# Patient Record
Sex: Male | Born: 1945 | Race: Black or African American | Hispanic: No | Marital: Married | Smoking: Former smoker
Health system: Southern US, Community
[De-identification: ages and names within clinical notes are randomized; demographics above are authoritative.]

## PROBLEM LIST (undated history)

## (undated) DIAGNOSIS — E119 Type 2 diabetes mellitus without complications: Secondary | ICD-10-CM

## (undated) DIAGNOSIS — K5792 Diverticulitis of intestine, part unspecified, without perforation or abscess without bleeding: Secondary | ICD-10-CM

## (undated) DIAGNOSIS — C801 Malignant (primary) neoplasm, unspecified: Secondary | ICD-10-CM

## (undated) DIAGNOSIS — N529 Male erectile dysfunction, unspecified: Secondary | ICD-10-CM

## (undated) DIAGNOSIS — A6000 Herpesviral infection of urogenital system, unspecified: Secondary | ICD-10-CM

## (undated) DIAGNOSIS — S83209A Unspecified tear of unspecified meniscus, current injury, unspecified knee, initial encounter: Secondary | ICD-10-CM

## (undated) HISTORY — DX: Male erectile dysfunction, unspecified: N52.9

## (undated) HISTORY — DX: Diverticulitis of intestine, part unspecified, without perforation or abscess without bleeding: K57.92

## (undated) HISTORY — DX: Malignant (primary) neoplasm, unspecified: C80.1

## (undated) HISTORY — DX: Type 2 diabetes mellitus without complications: E11.9

## (undated) HISTORY — PX: OTHER SURGICAL HISTORY: SHX169

## (undated) HISTORY — PX: HEMORRHOID SURGERY: SHX153

## (undated) HISTORY — DX: Unspecified tear of unspecified meniscus, current injury, unspecified knee, initial encounter: S83.209A

## (undated) HISTORY — DX: Herpesviral infection of urogenital system, unspecified: A60.00

---

## 2001-01-10 ENCOUNTER — Other Ambulatory Visit: Admission: RE | Admit: 2001-01-10 | Discharge: 2001-01-10 | Payer: Self-pay | Admitting: Urology

## 2001-02-16 HISTORY — PX: PROSTATECTOMY: SHX69

## 2001-02-17 ENCOUNTER — Encounter: Payer: Self-pay | Admitting: Urology

## 2001-02-24 ENCOUNTER — Inpatient Hospital Stay (HOSPITAL_COMMUNITY): Admission: RE | Admit: 2001-02-24 | Discharge: 2001-02-26 | Payer: Self-pay | Admitting: Urology

## 2004-07-19 ENCOUNTER — Emergency Department (HOSPITAL_COMMUNITY): Admission: EM | Admit: 2004-07-19 | Discharge: 2004-07-20 | Payer: Self-pay | Admitting: Emergency Medicine

## 2005-10-19 DIAGNOSIS — K5792 Diverticulitis of intestine, part unspecified, without perforation or abscess without bleeding: Secondary | ICD-10-CM

## 2005-10-19 HISTORY — PX: OTHER SURGICAL HISTORY: SHX169

## 2005-10-19 HISTORY — DX: Diverticulitis of intestine, part unspecified, without perforation or abscess without bleeding: K57.92

## 2006-03-01 ENCOUNTER — Inpatient Hospital Stay (HOSPITAL_COMMUNITY): Admission: EM | Admit: 2006-03-01 | Discharge: 2006-03-10 | Payer: Self-pay | Admitting: Emergency Medicine

## 2012-03-08 ENCOUNTER — Ambulatory Visit (HOSPITAL_COMMUNITY)
Admission: RE | Admit: 2012-03-08 | Discharge: 2012-03-08 | Disposition: A | Discharge status: Home / Self Care | Payer: Federal, State, Local not specified - PPO | Source: Ambulatory Visit | Attending: Chiropractic Medicine | Admitting: Chiropractic Medicine

## 2012-03-08 ENCOUNTER — Other Ambulatory Visit (HOSPITAL_COMMUNITY): Payer: Self-pay | Admitting: Chiropractic Medicine

## 2012-03-08 DIAGNOSIS — R52 Pain, unspecified: Secondary | ICD-10-CM

## 2012-03-08 DIAGNOSIS — M5030 Other cervical disc degeneration, unspecified cervical region: Secondary | ICD-10-CM | POA: Insufficient documentation

## 2012-03-08 DIAGNOSIS — M542 Cervicalgia: Secondary | ICD-10-CM | POA: Insufficient documentation

## 2012-03-08 DIAGNOSIS — M47812 Spondylosis without myelopathy or radiculopathy, cervical region: Secondary | ICD-10-CM | POA: Insufficient documentation

## 2013-10-04 ENCOUNTER — Other Ambulatory Visit: Payer: Self-pay | Admitting: Internal Medicine

## 2013-10-04 DIAGNOSIS — N63 Unspecified lump in unspecified breast: Secondary | ICD-10-CM

## 2013-10-06 ENCOUNTER — Ambulatory Visit
Admission: RE | Admit: 2013-10-06 | Discharge: 2013-10-06 | Disposition: A | Discharge status: Home / Self Care | Payer: Federal, State, Local not specified - PPO | Source: Ambulatory Visit | Attending: Internal Medicine | Admitting: Internal Medicine

## 2013-10-06 DIAGNOSIS — N63 Unspecified lump in unspecified breast: Secondary | ICD-10-CM

## 2014-04-16 ENCOUNTER — Other Ambulatory Visit: Payer: Self-pay | Admitting: Internal Medicine

## 2014-04-16 DIAGNOSIS — N508 Other specified disorders of male genital organs: Secondary | ICD-10-CM

## 2014-04-18 ENCOUNTER — Ambulatory Visit
Admission: RE | Admit: 2014-04-18 | Discharge: 2014-04-18 | Disposition: A | Discharge status: Home / Self Care | Payer: Federal, State, Local not specified - PPO | Source: Ambulatory Visit | Attending: Internal Medicine | Admitting: Internal Medicine

## 2014-04-18 DIAGNOSIS — N508 Other specified disorders of male genital organs: Secondary | ICD-10-CM

## 2014-04-26 ENCOUNTER — Other Ambulatory Visit: Payer: Self-pay | Admitting: Internal Medicine

## 2014-04-26 DIAGNOSIS — M7989 Other specified soft tissue disorders: Secondary | ICD-10-CM

## 2014-05-11 ENCOUNTER — Ambulatory Visit
Admission: RE | Admit: 2014-05-11 | Discharge: 2014-05-11 | Disposition: A | Discharge status: Home / Self Care | Payer: Federal, State, Local not specified - PPO | Source: Ambulatory Visit | Attending: Internal Medicine | Admitting: Internal Medicine

## 2014-05-11 DIAGNOSIS — M7989 Other specified soft tissue disorders: Secondary | ICD-10-CM

## 2015-03-07 ENCOUNTER — Other Ambulatory Visit: Payer: Self-pay | Admitting: Chiropractic Medicine

## 2015-03-07 DIAGNOSIS — M5441 Lumbago with sciatica, right side: Secondary | ICD-10-CM

## 2015-09-02 ENCOUNTER — Ambulatory Visit
Admission: RE | Admit: 2015-09-02 | Discharge: 2015-09-02 | Disposition: A | Discharge status: Home / Self Care | Payer: Federal, State, Local not specified - PPO | Source: Ambulatory Visit | Attending: Internal Medicine | Admitting: Internal Medicine

## 2015-09-02 ENCOUNTER — Other Ambulatory Visit: Payer: Self-pay | Admitting: Internal Medicine

## 2015-09-02 DIAGNOSIS — M542 Cervicalgia: Secondary | ICD-10-CM

## 2015-09-19 DEATH — deceased

## 2017-11-12 IMAGING — CR DG SHOULDER 2+V*L*
3 series · 3 of 3 positions shown · non-contrast
Comparison: None.

CLINICAL DATA: Injured shoulder on 08/31/2015. Limited range of
motion.

EXAM:
LEFT SHOULDER - 2+ VIEW

[w shoulder ap internal left *]
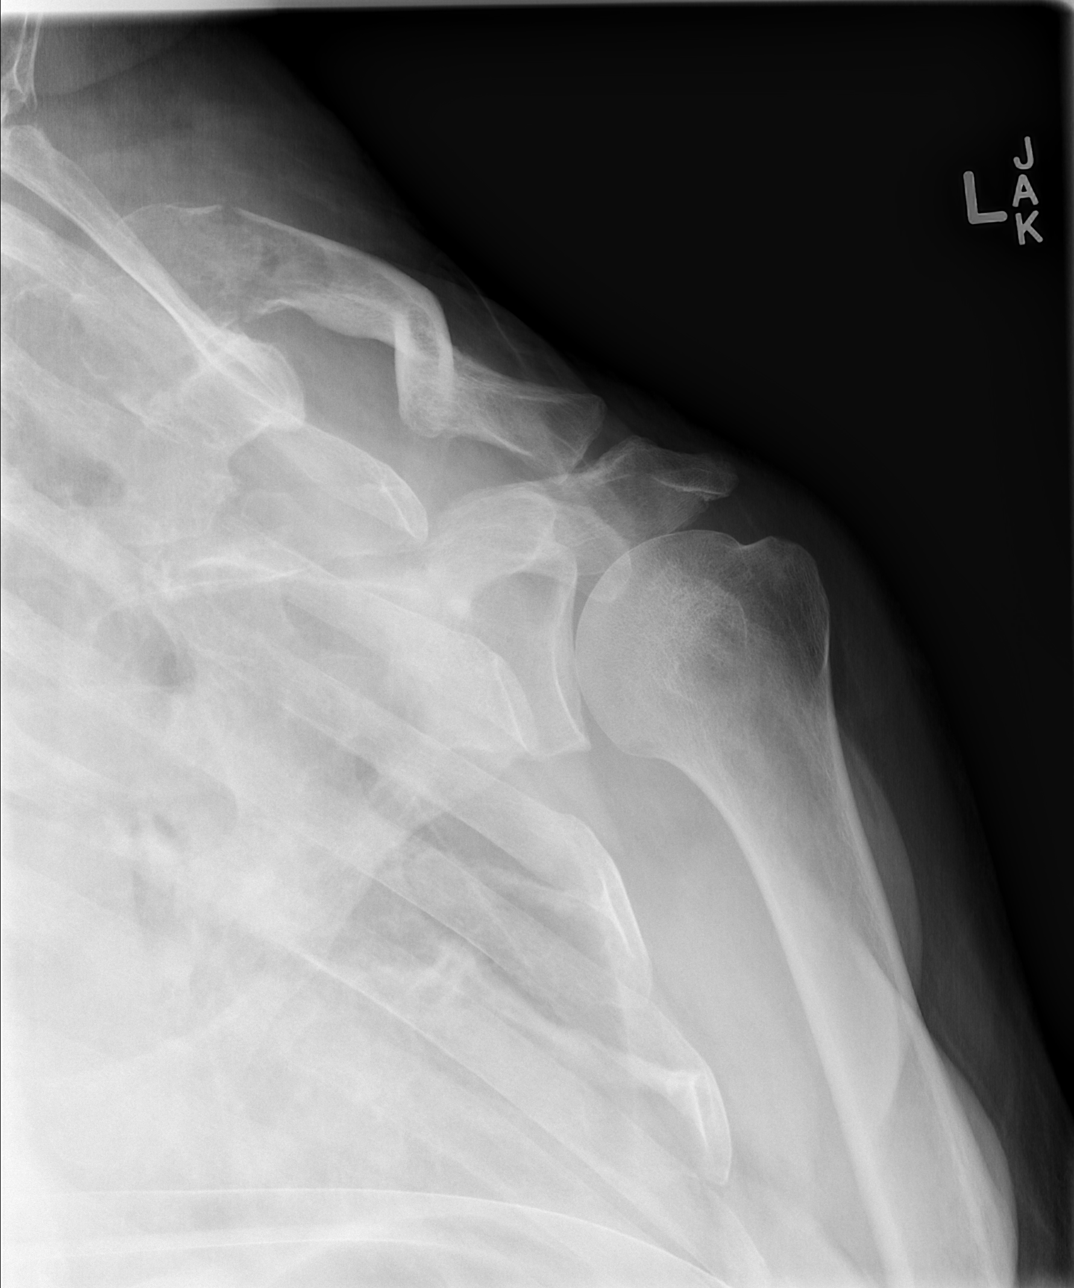

[w shoulder ap external left *]
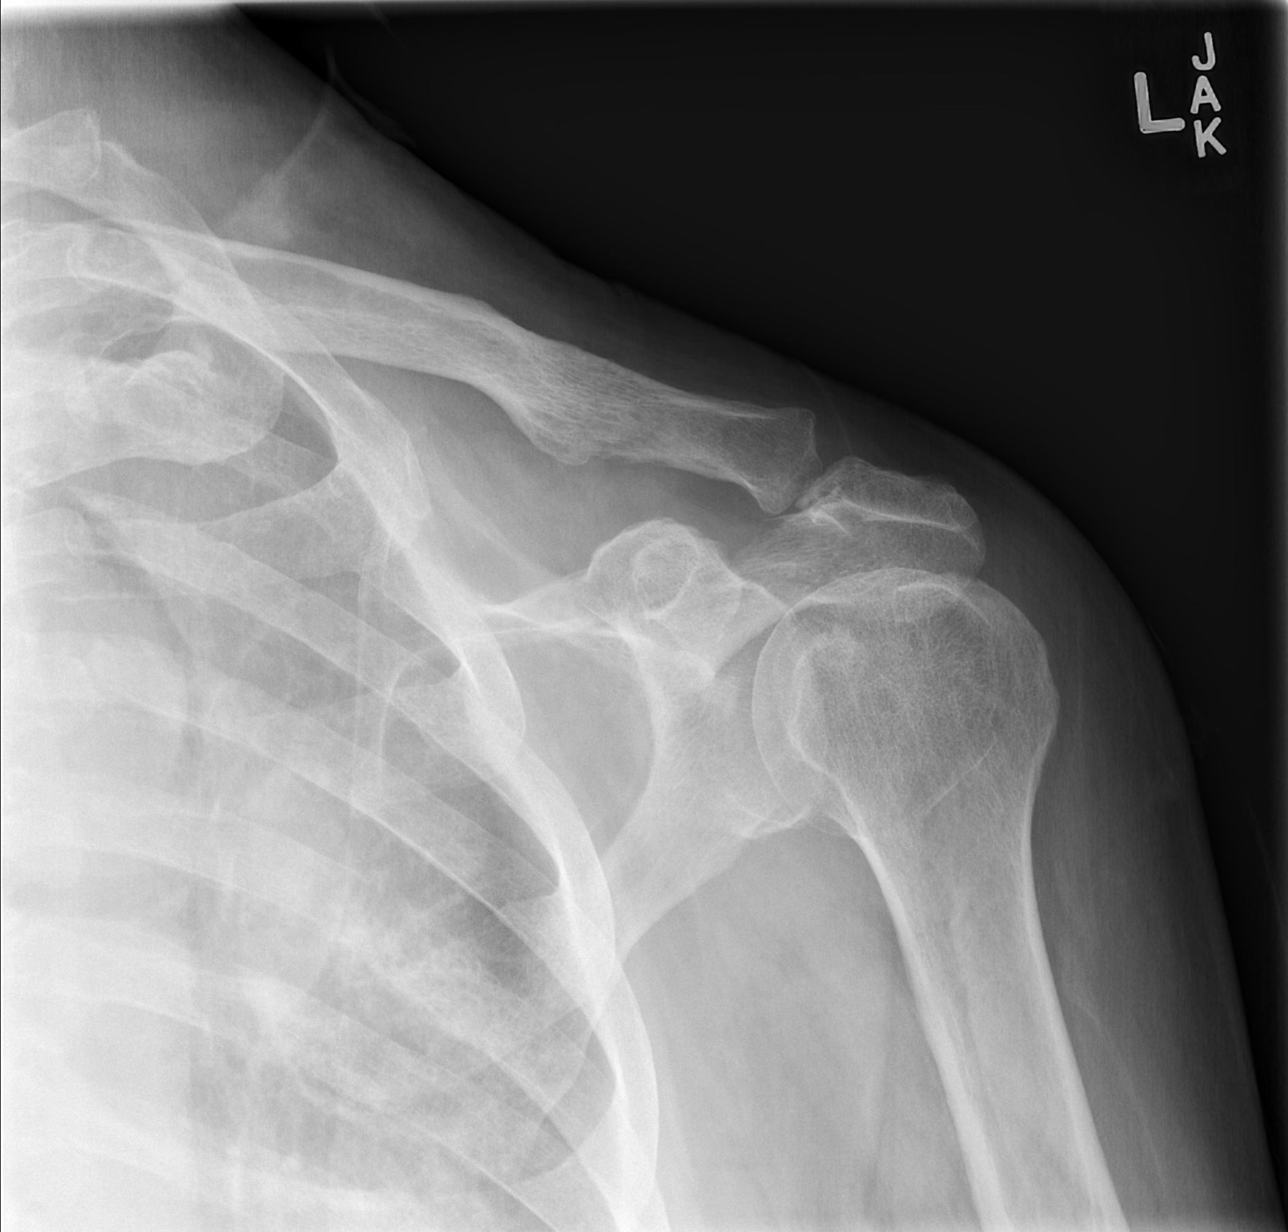

[w shoulder y view left *]
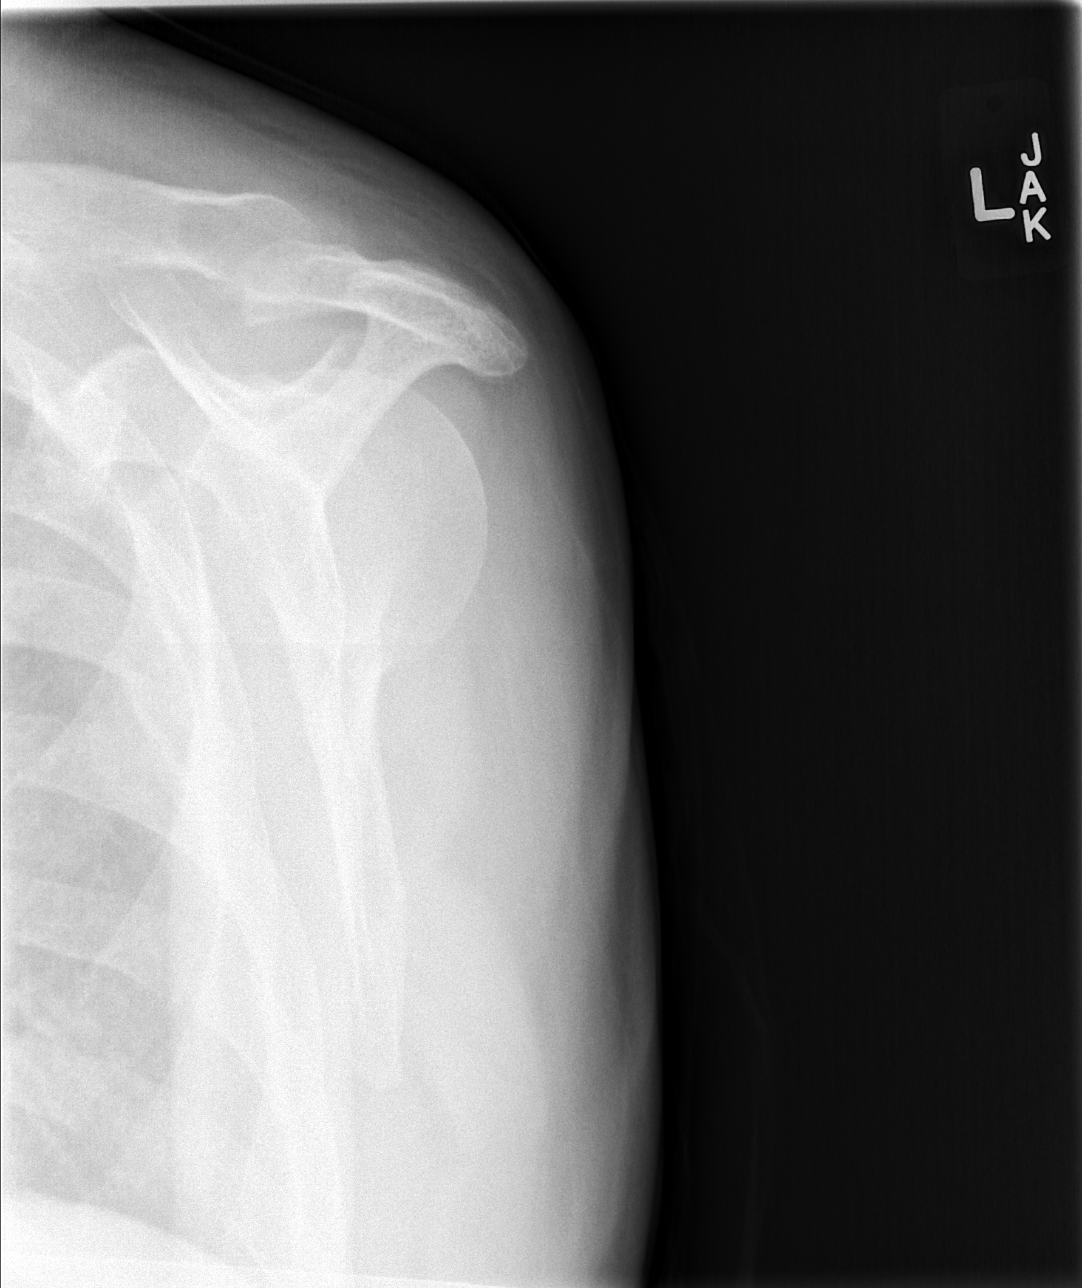

[3 of 3 positions shown; findings below may reference images not displayed]

FINDINGS: The joint spaces are maintained. Mild degenerative changes. No acute
fracture is identified. No definite rib fractures.Left lung opacity
is noted. This could be radiation change related to the left lung
nodule.
IMPRESSION: No acute fracture.  Mild degenerative changes.

Left lung opacity could reflect radiation changes. Known left upper
lobe pulmonary nodule.

## 2017-11-12 IMAGING — CR DG CERVICAL SPINE 2 OR 3 VIEWS
3 series · 3 of 3 positions shown · non-contrast
Comparison: 03/08/2012

CLINICAL DATA: Injury 08/31/2015 with pain and decreased range of
motion

EXAM:
CERVICAL SPINE - 2-3 VIEW

[w c-spine lat]
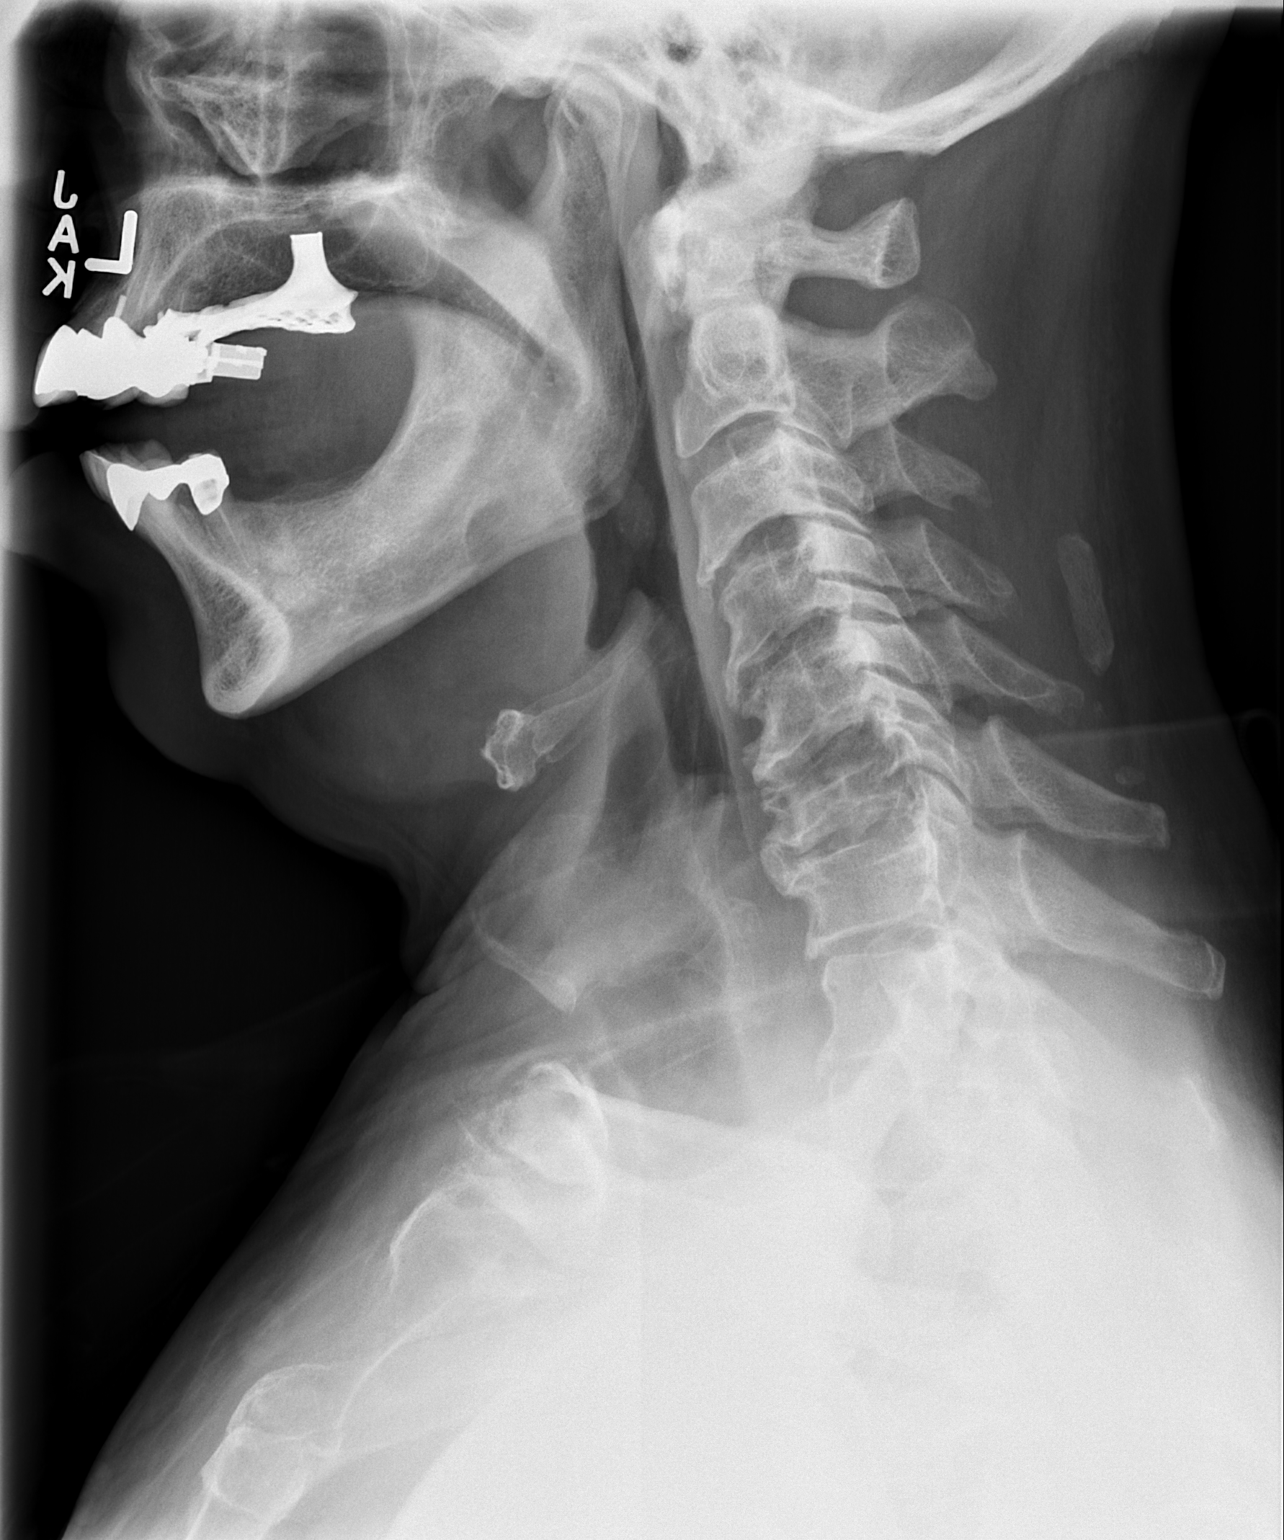

[w c-spine a.p. *]
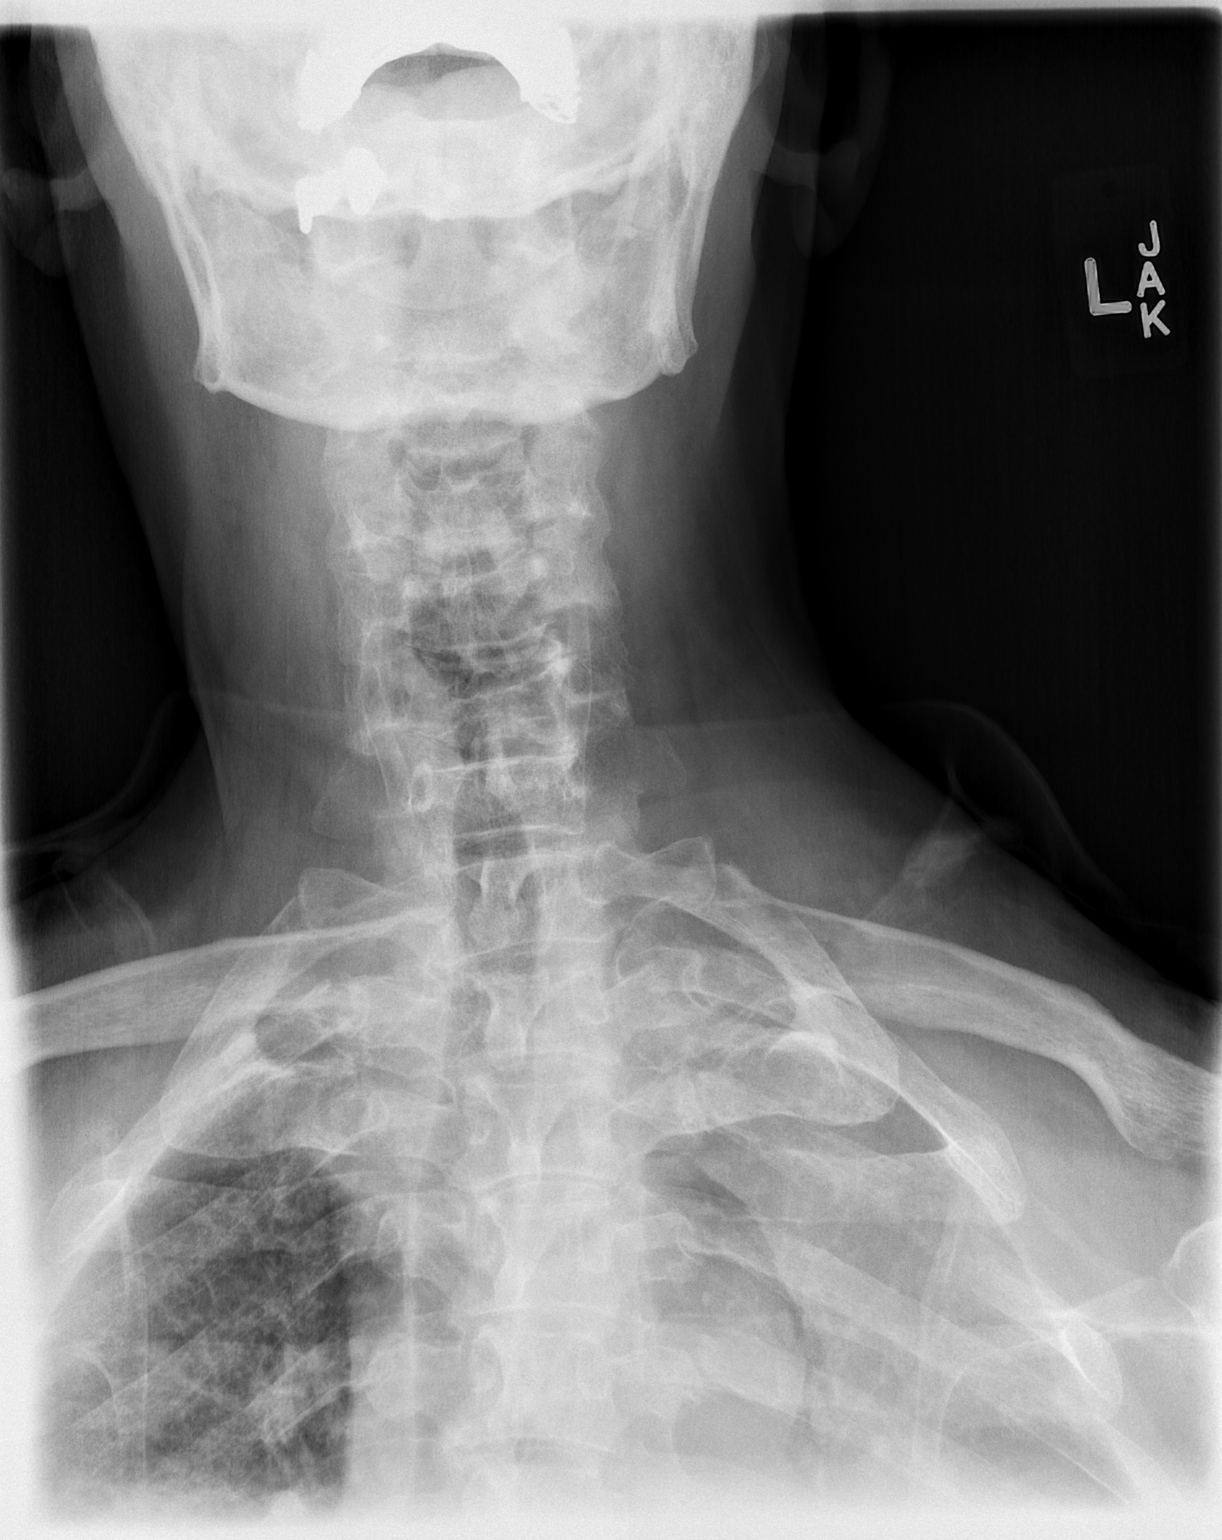

[w c-spine odontoid *]
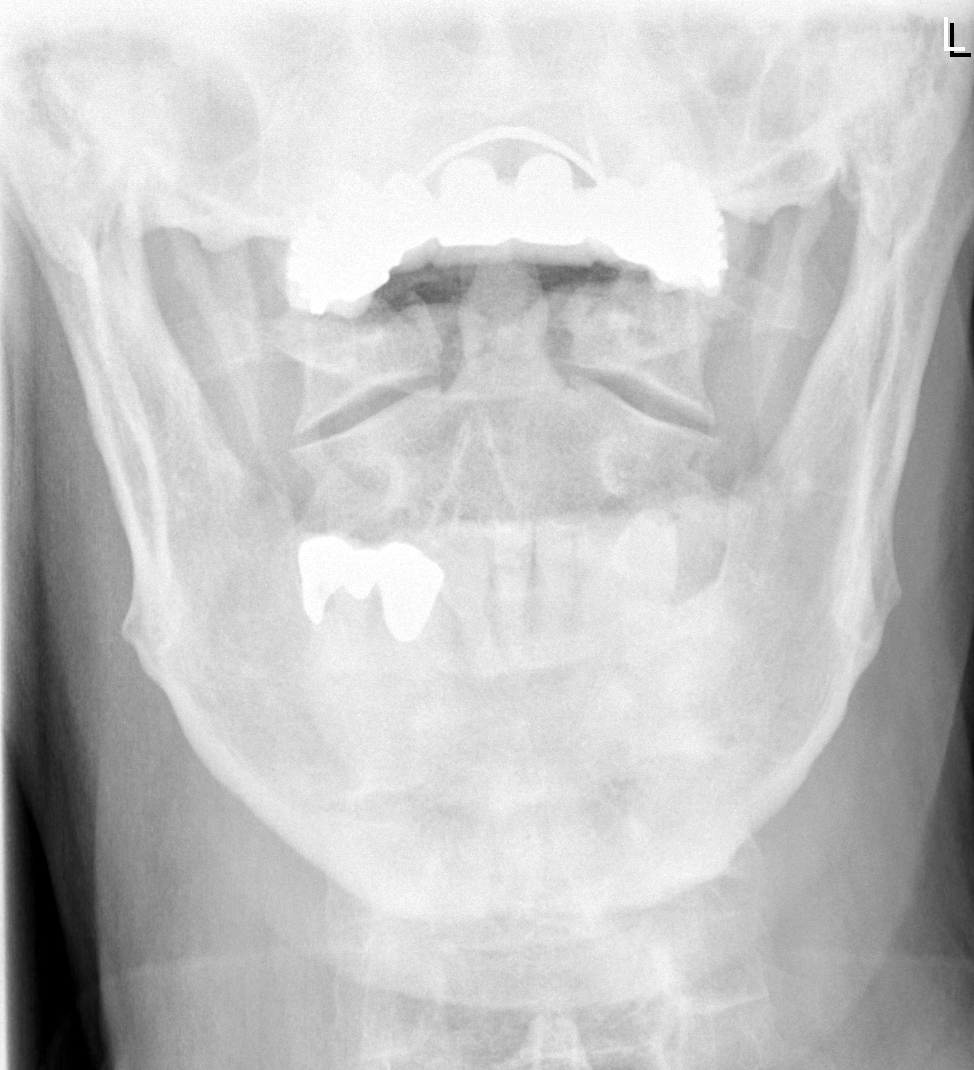

[3 of 3 positions shown; findings below may reference images not displayed]

FINDINGS: Progressive significant multilevel degenerative disc disease.
Moderate C3-4 degenerative disc disease with severe degenerative
disc disease at C4-5, C5-6, and C6-7. Moderate C7-T1 and T1-T2
degenerative disc disease. C6 vertebral body demonstrates mild
compression deformity when compared to 1690.
IMPRESSION: Progressive degenerative change. Mild compression deformity at C6 of
uncertain age new from 1690.
